# Patient Record
Sex: Female | Born: 1970 | Race: White | Hispanic: Yes | Marital: Married
Health system: Southern US, Community
[De-identification: ages and names within clinical notes are randomized; demographics above are authoritative.]

---

## 2005-11-01 ENCOUNTER — Emergency Department (HOSPITAL_COMMUNITY): Admission: EM | Admit: 2005-11-01 | Discharge: 2005-11-01 | Payer: Self-pay | Admitting: Emergency Medicine

## 2005-11-09 ENCOUNTER — Emergency Department (HOSPITAL_COMMUNITY): Admission: EM | Admit: 2005-11-09 | Discharge: 2005-11-09 | Payer: Self-pay | Admitting: Emergency Medicine

## 2005-12-18 ENCOUNTER — Inpatient Hospital Stay (HOSPITAL_COMMUNITY): Admission: AD | Admit: 2005-12-18 | Discharge: 2005-12-20 | Payer: Self-pay | Admitting: Obstetrics & Gynecology

## 2006-05-13 ENCOUNTER — Ambulatory Visit (HOSPITAL_COMMUNITY): Admission: RE | Admit: 2006-05-13 | Discharge: 2006-05-13 | Payer: Self-pay | Admitting: Obstetrics & Gynecology

## 2006-05-13 ENCOUNTER — Ambulatory Visit: Payer: Self-pay | Admitting: Obstetrics & Gynecology

## 2006-05-22 ENCOUNTER — Ambulatory Visit: Payer: Self-pay | Admitting: Gynecology

## 2006-06-05 ENCOUNTER — Ambulatory Visit: Payer: Self-pay | Admitting: Gynecology

## 2006-06-10 ENCOUNTER — Ambulatory Visit: Payer: Self-pay | Admitting: Certified Nurse Midwife

## 2006-06-10 ENCOUNTER — Inpatient Hospital Stay (HOSPITAL_COMMUNITY): Admission: AD | Admit: 2006-06-10 | Discharge: 2006-06-13 | Payer: Self-pay | Admitting: Family Medicine

## 2010-09-28 ENCOUNTER — Emergency Department (HOSPITAL_COMMUNITY): Admission: EM | Admit: 2010-09-28 | Discharge: 2010-09-28 | Payer: Self-pay | Admitting: Emergency Medicine

## 2010-09-29 ENCOUNTER — Ambulatory Visit (HOSPITAL_COMMUNITY): Admission: RE | Admit: 2010-09-29 | Discharge: 2010-09-29 | Payer: Self-pay | Admitting: Emergency Medicine

## 2011-03-07 LAB — URINALYSIS, ROUTINE W REFLEX MICROSCOPIC
Bilirubin Urine: NEGATIVE
Glucose, UA: NEGATIVE mg/dL
Hgb urine dipstick: NEGATIVE
Ketones, ur: NEGATIVE mg/dL
Nitrite: NEGATIVE
Protein, ur: NEGATIVE mg/dL
Specific Gravity, Urine: <1.005 — ABNORMAL LOW (ref 1.005–1.030)
Urobilinogen, UA: 0.2 mg/dL (ref 0.0–1.0)
pH: 5.5 (ref 5.0–8.0)

## 2011-03-07 LAB — COMPREHENSIVE METABOLIC PANEL
ALT: 18 U/L (ref 0–35)
AST: 20 U/L (ref 0–37)
Albumin: 4 g/dL (ref 3.5–5.2)
Alkaline Phosphatase: 57 U/L (ref 39–117)
BUN: 7 mg/dL (ref 6–23)
CO2: 25 mEq/L (ref 19–32)
Calcium: 8.9 mg/dL (ref 8.4–10.5)
Chloride: 107 mEq/L (ref 96–112)
Creatinine, Ser: 0.49 mg/dL (ref 0.4–1.2)
GFR calc Af Amer: >60 mL/min (ref >60)
GFR calc non Af Amer: >60 mL/min (ref >60)
Glucose, Bld: 106 mg/dL — ABNORMAL HIGH (ref 70–99)
Potassium: 3.5 mEq/L (ref 3.5–5.1)
Sodium: 138 mEq/L (ref 135–145)
Total Bilirubin: 0.5 mg/dL (ref 0.3–1.2)
Total Protein: 7.6 g/dL (ref 6.0–8.3)

## 2011-03-07 LAB — CBC
HCT: 38 % (ref 36.0–46.0)
Hemoglobin: 13.3 g/dL (ref 12.0–15.0)
MCH: 33.2 pg (ref 26.0–34.0)
MCHC: 35 g/dL (ref 30.0–36.0)
MCV: 94.9 fL (ref 78.0–100.0)
Platelets: 289 10*3/uL (ref 150–400)
RBC: 4 MIL/uL (ref 3.87–5.11)
RDW: 12.2 % (ref 11.5–15.5)
WBC: 7.4 10*3/uL (ref 4.0–10.5)

## 2011-03-07 LAB — LIPASE, BLOOD: Lipase: 35 U/L (ref 11–59)

## 2011-03-07 LAB — DIFFERENTIAL
Band Neutrophils: 0 % (ref 0–10)
Basophils Absolute: 0 10*3/uL (ref 0.0–0.1)
Basophils Relative: 1 % (ref 0–1)
Eosinophils Absolute: 0.1 10*3/uL (ref 0.0–0.7)
Eosinophils Relative: 1 % (ref 0–5)
Lymphocytes Relative: 27 % (ref 12–46)
Lymphs Abs: 2 10*3/uL (ref 0.7–4.0)
Monocytes Absolute: 0.6 10*3/uL (ref 0.1–1.0)
Monocytes Relative: 8 % (ref 3–12)
Neutro Abs: 4.7 10*3/uL (ref 1.7–7.7)
Neutrophils Relative %: 63 % (ref 43–77)

## 2011-03-07 LAB — PREGNANCY, URINE: Preg Test, Ur: NEGATIVE

## 2011-03-07 LAB — URINE MICROSCOPIC-ADD ON

## 2011-05-10 NOTE — H&P (Signed)
Hannah [name], Hannah [name]             ACCOUNT NO.:  [id number]   MEDICAL RECORD NO.:  [id number]          PATIENT TYPE:  OIB   LOCATION:  A428                          FACILITY:  [facility]   PHYSICIAN:  Tilda Burrow, M.D. DATE OF BIRTH:  1971-08-09   DATE OF ADMISSION:  12/17/2005  DATE OF DISCHARGE:  LH                                HISTORY & PHYSICAL   REASON FOR ADMISSION:  Pregnancy at 12 weeks with fever and dehydration.   HISTORY OF PRESENT ILLNESS:  Hannah [name] is a 40 year old gravida 4, para 3,  previous Cesarean section who has been sick for three days with fever,  nausea and vomiting, no other symptoms.  She presented to the office this  morning with dehydration with 3+ ketones.   PAST MEDICAL HISTORY:  Medical history is positive for pregnancy-induced  hypertension with her first pregnancy.   PAST SURGICAL HISTORY:  Positive for Cesarean section.   ALLERGIES:  She has no known allergies.   SOCIAL HISTORY:  She is married.  Her husband is with her.   OBSTETRICAL HISTORY:  Her prenatal course has been complicated with a  complete previa which was noted on December 04, 2005.  Blood type is O  positive.  Urine drug screen is negative.  Rubella is immune.  Hepatitis B  surface antigen is negative.  Human immunodeficiency virus is negative.  Serology is nonreactive.  Gonorrhea and Chlamydia are negative.   PHYSICAL EXAMINATION:  HEART:  On physical examination today the heart is  regular to rhythm and rate.  LUNGS:  Clear to auscultation bilaterally.  VITAL SIGNS:  Temperature is 100.3 orally.  Patient has had Tylenol several  hours ago.  Fetal heart rate 170's strong and regular with Doppler.  ABDOMEN:  There is some abdominal tenderness noted and it is more tender on  the left side than the right. She does complain of moderate to severe left  lower quadrant pain.   PLAN:  We are going to admit. Get a CBC, CMP, hydration, get an ultrasound  to assess her left lower quadrant  pain and consult Dr. Emelda Fear in regards  to treatment plans.      Zerita Boers, Lanier Clam      Tilda Burrow, M.D.  Electronically Signed    DL/MEDQ  D:  40/98/1191  T:  12/17/2005  Job:  [id number]

## 2011-05-10 NOTE — Discharge Summary (Signed)
[name], [name]            ACCOUNT NO.:  [id number]   MEDICAL RECORD NO.:  [id number]          PATIENT TYPE:  INP   LOCATION:  A428                          FACILITY:  [facility]   PHYSICIAN:  Lazaro Arms, M.D.   DATE OF BIRTH:  13-Apr-1971   DATE OF ADMISSION:  12/18/2005  DATE OF DISCHARGE:  12/29/2006LH                                 DISCHARGE SUMMARY   DISCHARGE DIAGNOSES:  1.  Intrauterine pregnancy at 12-weeks gestation.  2.  Viral gastroenteritis.  3.  Urinary tract infection.   HISTORY OF PRESENT ILLNESS:  Please refer to the transcribed History and  Physical for details of admission to the hospital.   HOSPITAL COURSE:  Hannah [name] was admitted.  She is a gravida 4, para 3, admitted  with a three day history of nausea, vomiting, fever and dehydration.  She  came to the office and decided to put her in the hospital for IV fluids and  emesis control.  The patient was also found to have large number of white  cells and positive nitrites in the urine, and she was treated with Macrobid.  Her potassium was 2.9 and after IV fluid came up to 3.2.  Her nausea and  vomiting resolved.  She felt much better and had no diarrhea and no fever 24  hours prior to her discharge.  She was discharged to home the morning of  December 20, 2005, to follow up in the office next week as scheduled.  She  was given Phenergan for nausea and Macrobid for her urinary tract infection  and instructions to come back to the office prior to her scheduled  appointment if needed.      Lazaro Arms, M.D.  Electronically Signed     LHE/MEDQ  D:  01/08/2006  T:  01/08/2006  Job:  [id number]

## 2012-06-15 ENCOUNTER — Other Ambulatory Visit (HOSPITAL_COMMUNITY): Payer: Self-pay | Admitting: Nurse Practitioner

## 2012-06-15 DIAGNOSIS — Z139 Encounter for screening, unspecified: Secondary | ICD-10-CM

## 2012-06-18 ENCOUNTER — Ambulatory Visit (HOSPITAL_COMMUNITY)
Admission: RE | Admit: 2012-06-18 | Discharge: 2012-06-18 | Disposition: A | Discharge status: Home / Self Care | Payer: Self-pay | Source: Ambulatory Visit | Attending: Nurse Practitioner | Admitting: Nurse Practitioner

## 2012-06-18 DIAGNOSIS — Z139 Encounter for screening, unspecified: Secondary | ICD-10-CM

## 2013-09-15 ENCOUNTER — Other Ambulatory Visit (HOSPITAL_COMMUNITY): Payer: Self-pay | Admitting: Nurse Practitioner

## 2013-09-15 DIAGNOSIS — Z139 Encounter for screening, unspecified: Secondary | ICD-10-CM

## 2013-09-21 ENCOUNTER — Ambulatory Visit (HOSPITAL_COMMUNITY)
Admission: RE | Admit: 2013-09-21 | Discharge: 2013-09-21 | Disposition: A | Discharge status: Home / Self Care | Payer: Self-pay | Source: Ambulatory Visit | Attending: Nurse Practitioner | Admitting: Nurse Practitioner

## 2013-09-21 DIAGNOSIS — Z139 Encounter for screening, unspecified: Secondary | ICD-10-CM

## 2014-12-12 ENCOUNTER — Other Ambulatory Visit (HOSPITAL_COMMUNITY): Payer: Self-pay | Admitting: Nurse Practitioner

## 2014-12-12 DIAGNOSIS — Z139 Encounter for screening, unspecified: Secondary | ICD-10-CM

## 2014-12-19 ENCOUNTER — Ambulatory Visit (HOSPITAL_COMMUNITY)
Admission: RE | Admit: 2014-12-19 | Discharge: 2014-12-19 | Disposition: A | Discharge status: Home / Self Care | Payer: Self-pay | Source: Ambulatory Visit | Attending: Nurse Practitioner | Admitting: Nurse Practitioner

## 2014-12-19 DIAGNOSIS — Z139 Encounter for screening, unspecified: Secondary | ICD-10-CM

## 2016-08-01 ENCOUNTER — Other Ambulatory Visit (HOSPITAL_COMMUNITY): Payer: Self-pay | Admitting: Nurse Practitioner

## 2016-08-01 DIAGNOSIS — Z1231 Encounter for screening mammogram for malignant neoplasm of breast: Secondary | ICD-10-CM

## 2016-08-07 ENCOUNTER — Encounter (HOSPITAL_COMMUNITY): Payer: Self-pay

## 2016-08-08 ENCOUNTER — Other Ambulatory Visit (HOSPITAL_COMMUNITY): Payer: Self-pay | Admitting: *Deleted

## 2016-08-08 DIAGNOSIS — N6452 Nipple discharge: Secondary | ICD-10-CM

## 2016-08-20 ENCOUNTER — Ambulatory Visit (HOSPITAL_COMMUNITY)
Admission: RE | Admit: 2016-08-20 | Discharge: 2016-08-20 | Disposition: A | Discharge status: Home / Self Care | Payer: PRIVATE HEALTH INSURANCE | Source: Ambulatory Visit | Attending: *Deleted | Admitting: *Deleted

## 2016-08-20 ENCOUNTER — Other Ambulatory Visit (HOSPITAL_COMMUNITY): Payer: Self-pay | Admitting: *Deleted

## 2016-08-20 ENCOUNTER — Ambulatory Visit (HOSPITAL_COMMUNITY): Payer: Self-pay

## 2016-08-20 ENCOUNTER — Inpatient Hospital Stay (HOSPITAL_COMMUNITY): Admission: RE | Admit: 2016-08-20 | Payer: Self-pay | Source: Ambulatory Visit

## 2016-08-20 ENCOUNTER — Ambulatory Visit (HOSPITAL_COMMUNITY): Payer: PRIVATE HEALTH INSURANCE

## 2016-08-20 DIAGNOSIS — N6452 Nipple discharge: Secondary | ICD-10-CM | POA: Diagnosis not present

## 2016-08-23 ENCOUNTER — Other Ambulatory Visit (HOSPITAL_COMMUNITY): Payer: Self-pay | Admitting: *Deleted

## 2016-08-23 DIAGNOSIS — N6452 Nipple discharge: Secondary | ICD-10-CM

## 2016-09-03 ENCOUNTER — Encounter (HOSPITAL_COMMUNITY): Payer: Self-pay

## 2016-09-03 ENCOUNTER — Other Ambulatory Visit (HOSPITAL_COMMUNITY): Payer: Self-pay | Admitting: *Deleted

## 2016-09-03 ENCOUNTER — Ambulatory Visit (HOSPITAL_COMMUNITY)
Admission: RE | Admit: 2016-09-03 | Discharge: 2016-09-03 | Disposition: A | Discharge status: Home / Self Care | Payer: PRIVATE HEALTH INSURANCE | Source: Ambulatory Visit | Attending: *Deleted | Admitting: *Deleted

## 2016-09-03 DIAGNOSIS — D241 Benign neoplasm of right breast: Secondary | ICD-10-CM | POA: Diagnosis not present

## 2016-09-03 DIAGNOSIS — N6310 Unspecified lump in the right breast, unspecified quadrant: Secondary | ICD-10-CM

## 2016-09-03 DIAGNOSIS — N6452 Nipple discharge: Secondary | ICD-10-CM | POA: Diagnosis present

## 2016-09-03 MED ORDER — LIDOCAINE-EPINEPHRINE (PF) 1 %-1:200000 IJ SOLN
INTRAMUSCULAR | Status: AC
  Administered 2016-09-03: 30 mL
  Filled 2016-09-03: qty 30

## 2016-09-03 MED ORDER — LIDOCAINE HCL (PF) 2 % IJ SOLN
INTRAMUSCULAR | Status: AC
  Administered 2016-09-03: 10 mL
  Filled 2016-09-03: qty 10

## 2016-09-25 IMAGING — US US  BREAST BX W/ LOC DEV 1ST LESION IMG BX SPEC US GUIDE*R*
1 series · 7 of 7 positions shown · non-contrast
Comparison: Previous exam(s).

ADDENDUM:
Pathology of the right breast biopsy revealed an INTRADUCTAL
PAPILLOMA. This was found to be concordant by Dr. Bremer.

Recommendation:  Surgical excision.
Results and recommendations were relayed to Ourari Tiger, [REDACTED]
coordinator for [facility] [REDACTED] on 09/04/16 by
Sanjaen, Jeanermann ([REDACTED]). She stated an appointment
has been made for the patient at [HOSPITAL] on
09/18/16 at [DATE]. Results and recommendations were relayed to the
patient by [place], interpreter for [facility] [REDACTED] on 09/04/16. She stated she has done well following the
biopsy with no bleeding, bruising, or hematoma. She was also
informed of her surgical appointment. All of her questions were
answered. She was encouraged to call the [facility] [REDACTED] with any further questions or concerns.
Addendum by Sanjaen, Jeanermann on 09/05/16.
CLINICAL DATA: 45-year-old female with history of spontaneous right
nipple discharge presenting for ultrasound-guided biopsy of a
subareolar right breast mass.
EXAM:
ULTRASOUND GUIDED RIGHT BREAST CORE NEEDLE BIOPSY

[Series 1: us breast bx w/ loc dev 1st lesion img bx spec us  · 0.07mm/px · 7 of 7 slices shown]
[im 1/7]
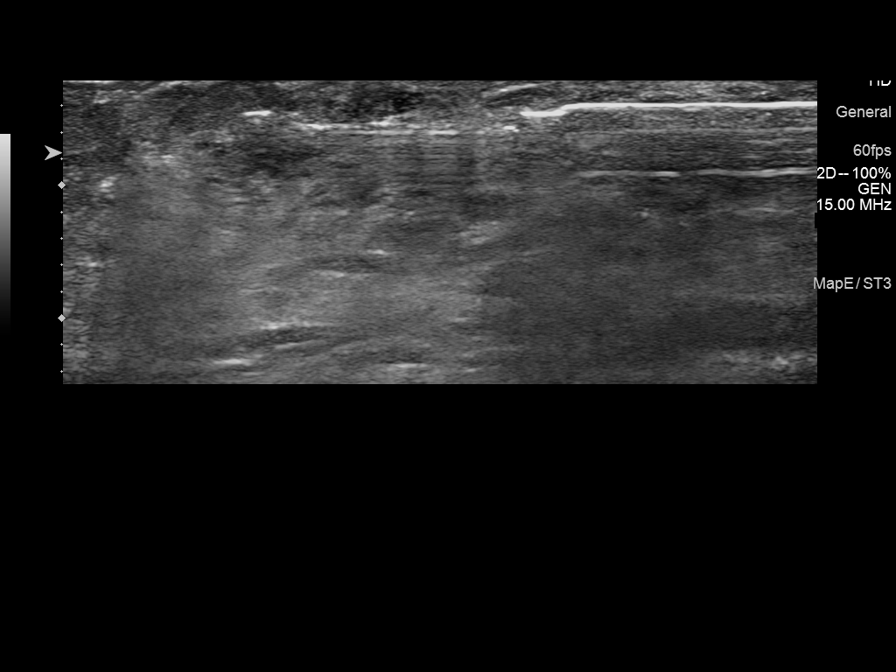
[im 2/7]
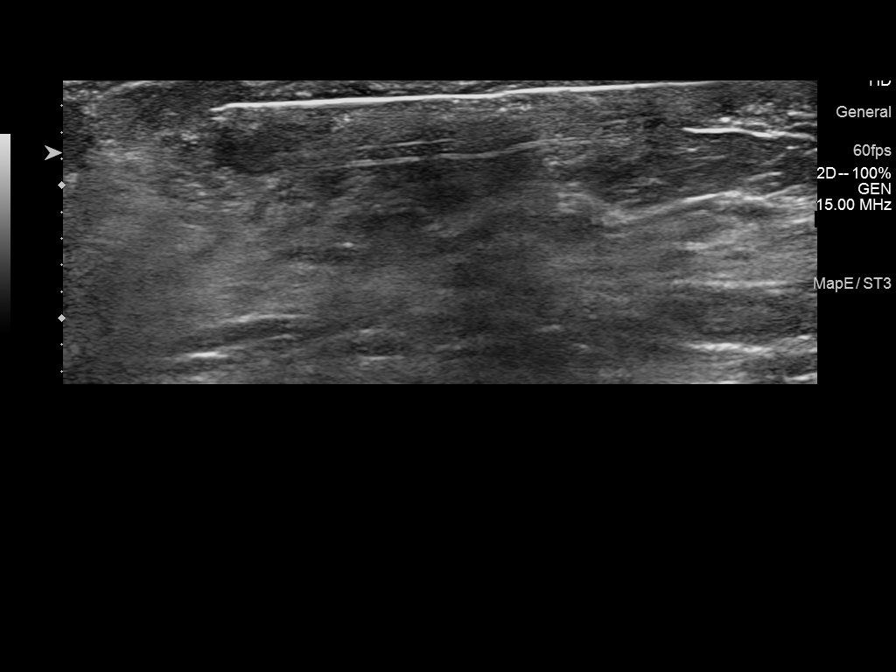
[im 3/7]
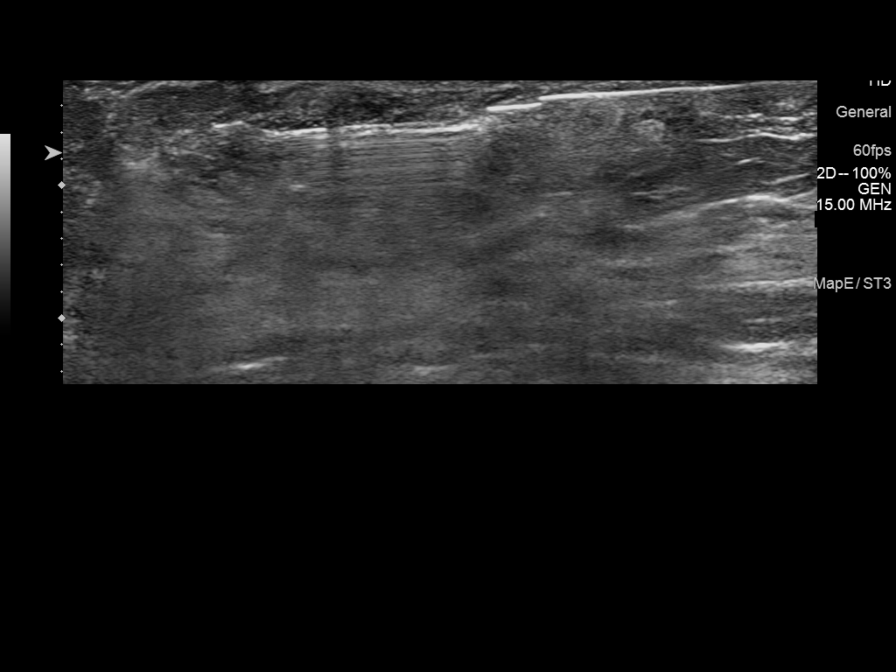
[im 4/7]
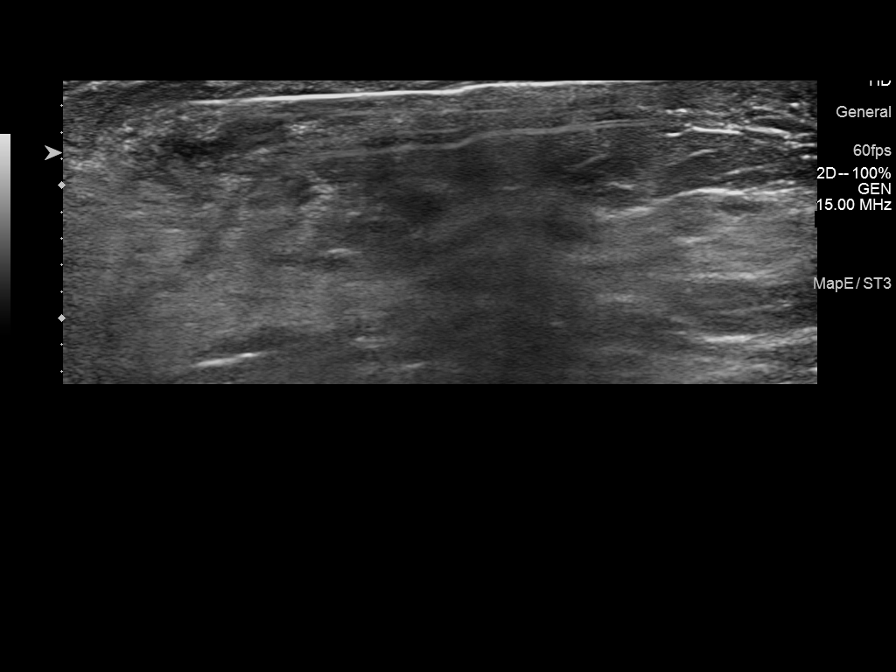
[im 5/7]
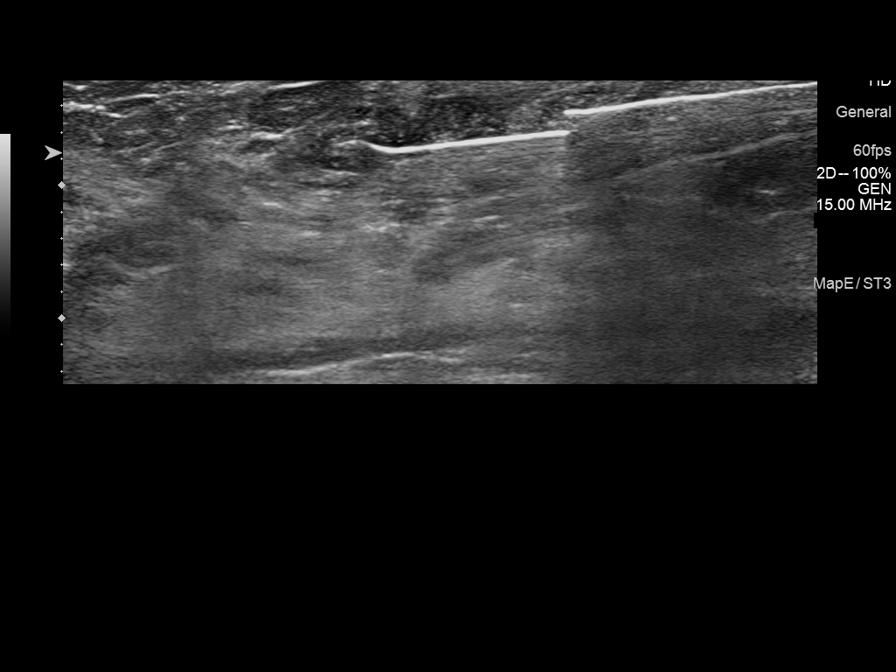
[im 6/7]
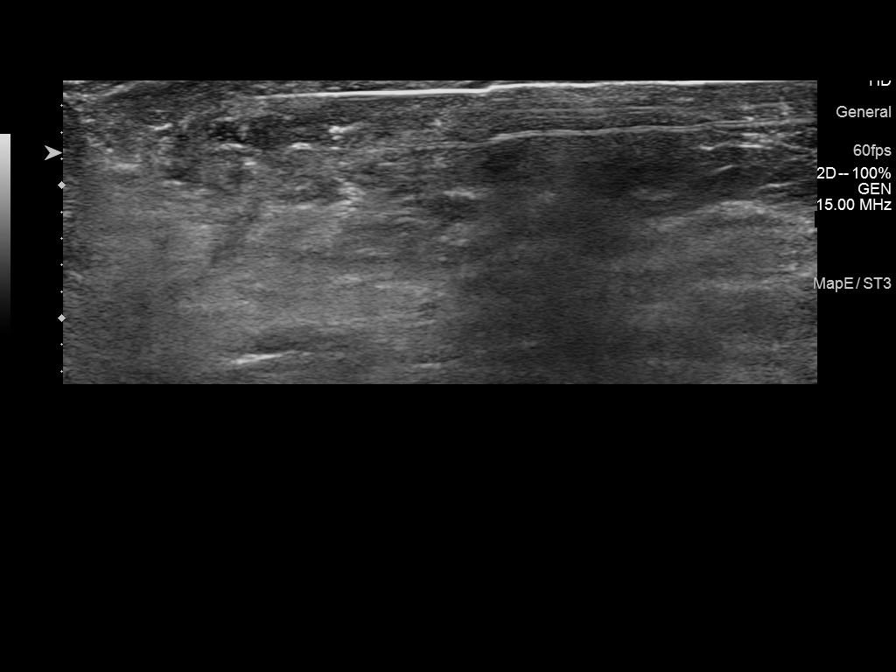
[im 7/7]
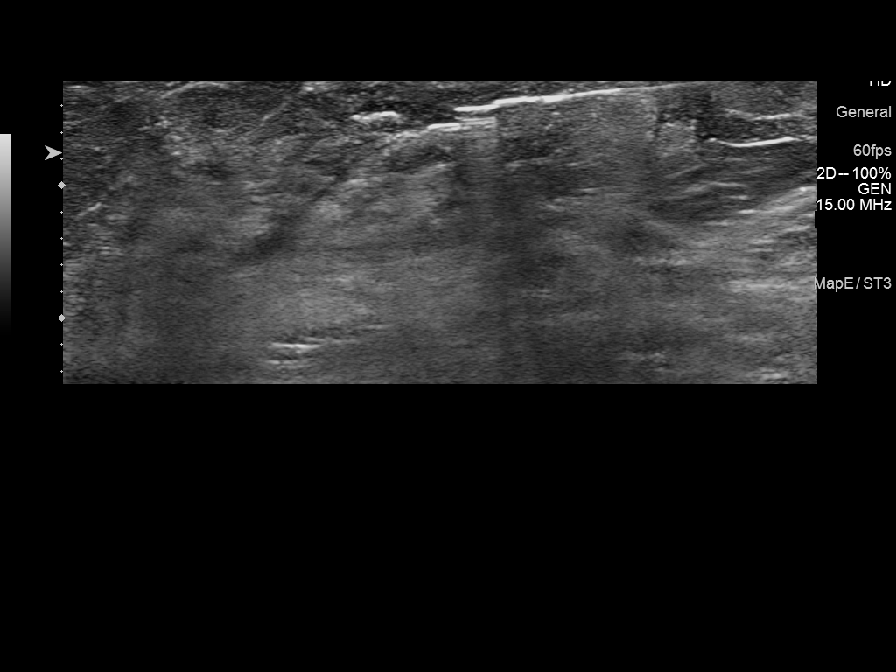

[7 of 7 positions shown; findings below may reference images not displayed]



Using sterile technique and 1% Lidocaine as local anesthetic, under
direct ultrasound visualization, a 14 gauge Tiger device was
used to perform biopsy of subareolar right breast mass using a
medial approach. At the conclusion of the procedure a ribbon shaped
tissue marker clip was deployed into the biopsy cavity. Follow up 2
view mammogram was performed and dictated separately.
IMPRESSION: Ultrasound guided biopsy of subareolar right breast mass. No
apparent complications.

## 2016-10-22 ENCOUNTER — Other Ambulatory Visit (HOSPITAL_COMMUNITY): Payer: Self-pay

## 2016-10-24 ENCOUNTER — Encounter (HOSPITAL_COMMUNITY): Admission: RE | Payer: Self-pay | Source: Ambulatory Visit

## 2016-10-24 ENCOUNTER — Ambulatory Visit (HOSPITAL_COMMUNITY): Admission: RE | Admit: 2016-10-24 | Payer: Self-pay | Source: Ambulatory Visit | Admitting: Surgery

## 2016-10-24 SURGERY — BREAST BIOPSY
Anesthesia: General | Laterality: Right

## 2017-09-05 IMAGING — US ULTRASOUND RIGHT BREAST LIMITED
1 series · 12 of 12 positions shown · non-contrast
Comparison: Previous exam(s).

CLINICAL DATA: 45-year-old female with history of clear right
nipple discharge. The patient states this has been spontaneous and
occurring for the approximate past 2 months. Patient feels as if
this only comes out centrally from the right nipple. Denies any left
nipple discharge.

EXAM:
2D DIGITAL DIAGNOSTIC BILATERAL MAMMOGRAM WITH CAD AND ADJUNCT TOMO
RIGHT BREAST ULTRASOUND

[Series 1: ultrasound right breast limited · 0.06mm/px · 12 of 12 slices shown]
[im 1/12]
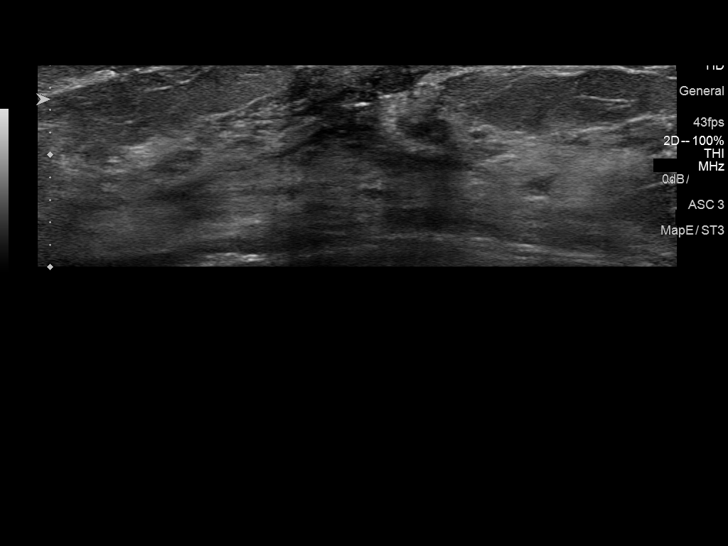
[im 2/12]
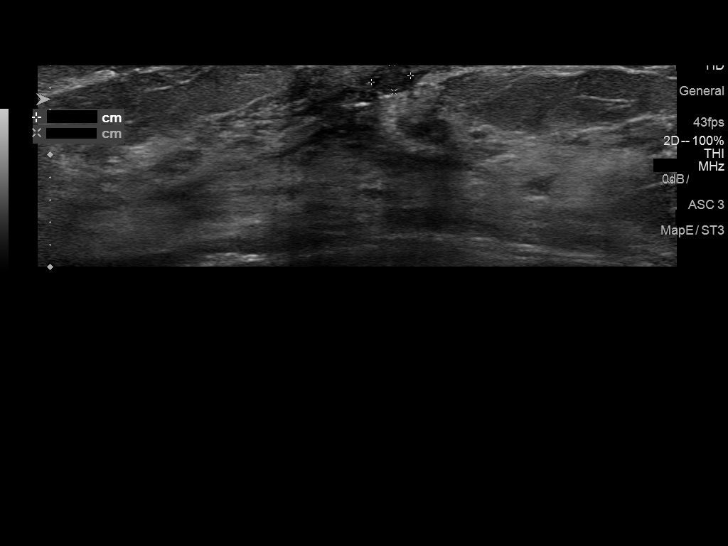
[im 3/12]
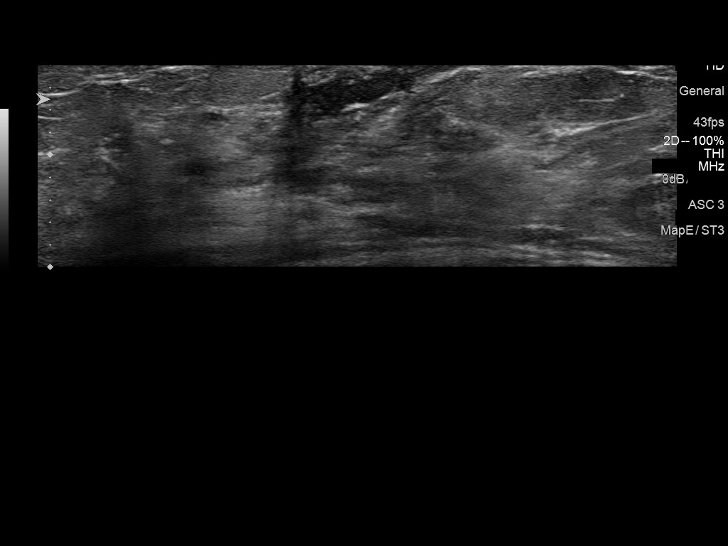
[im 4/12]
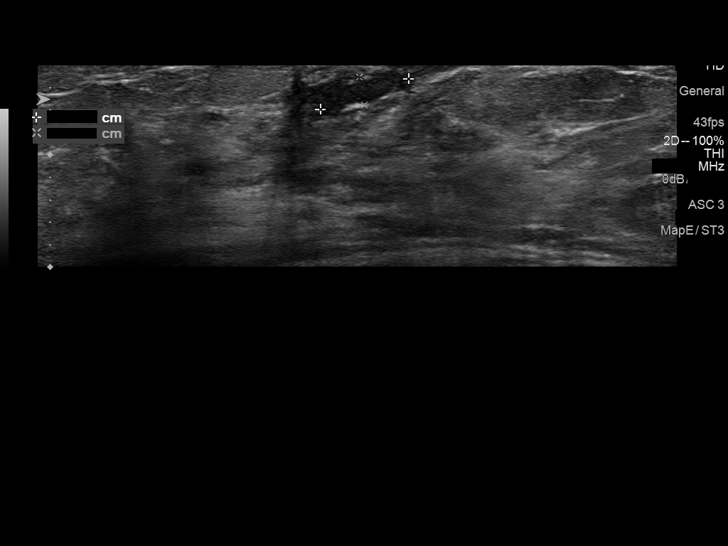
[im 5/12]
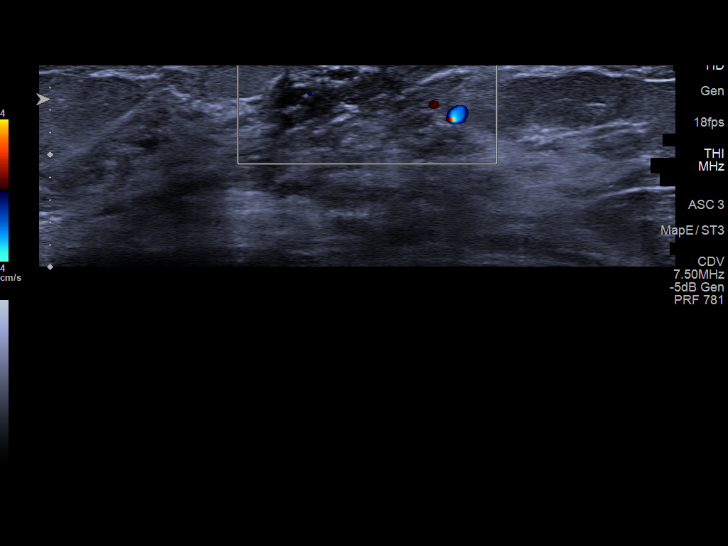
[im 6/12]
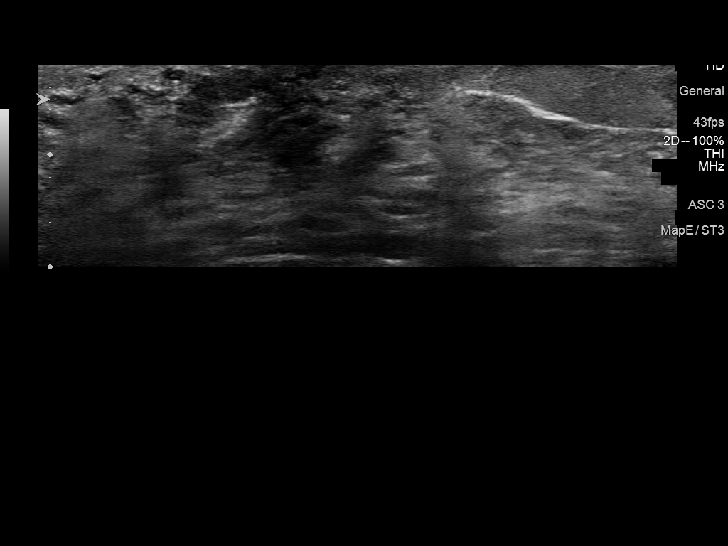
[im 7/12]
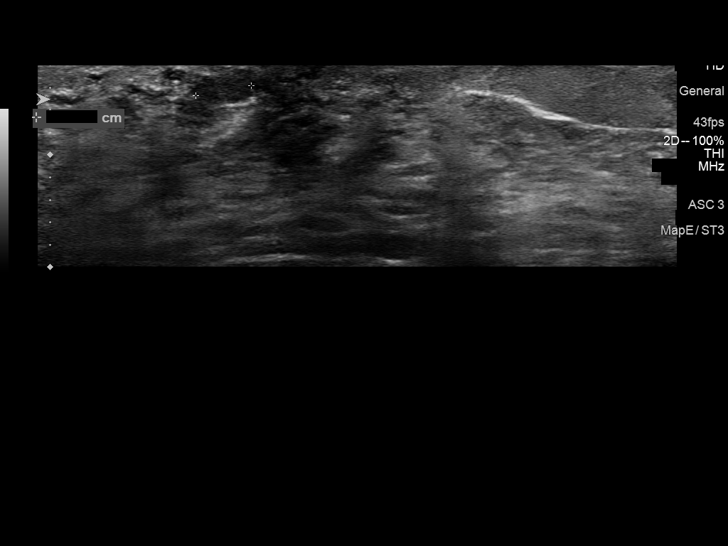
[im 8/12]
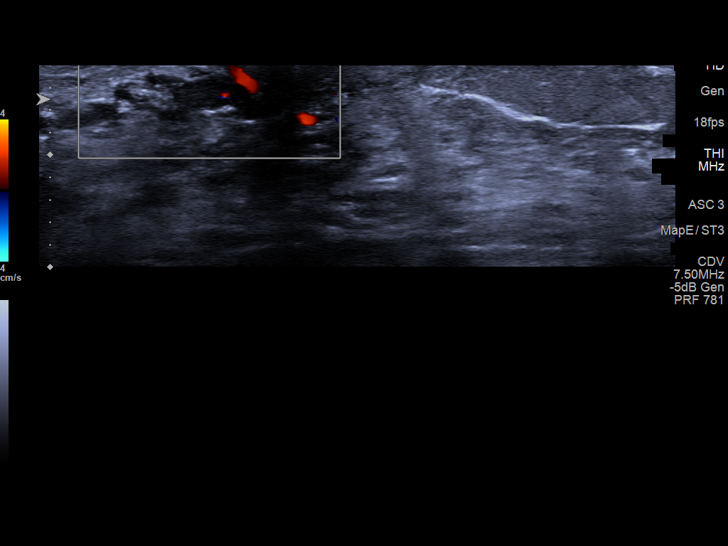
[im 9/12]
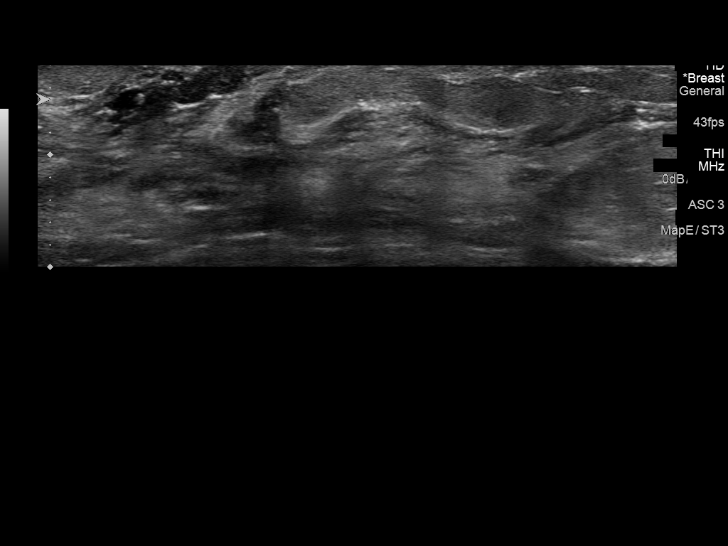
[im 10/12]
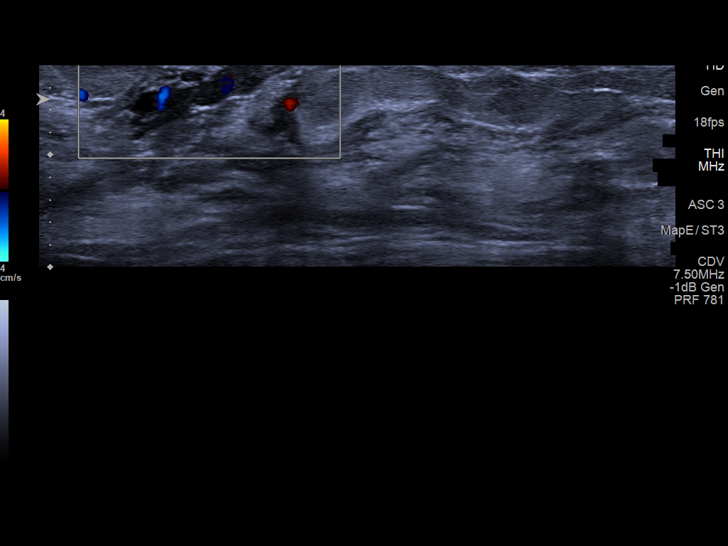
[im 11/12]
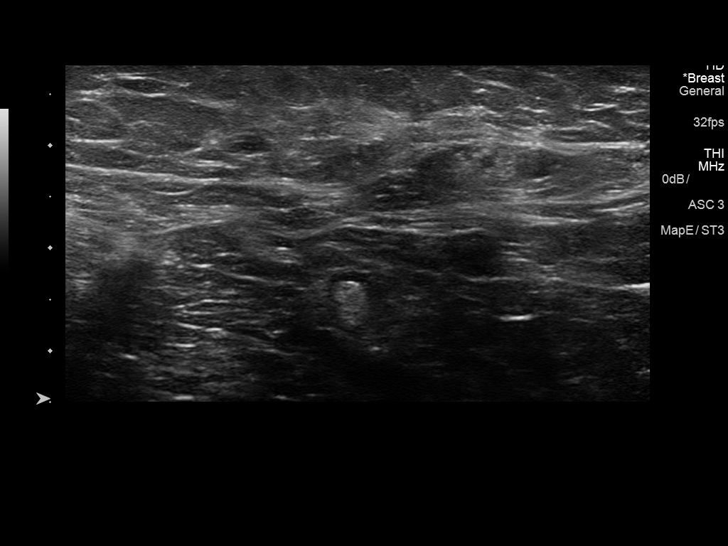
[im 12/12]
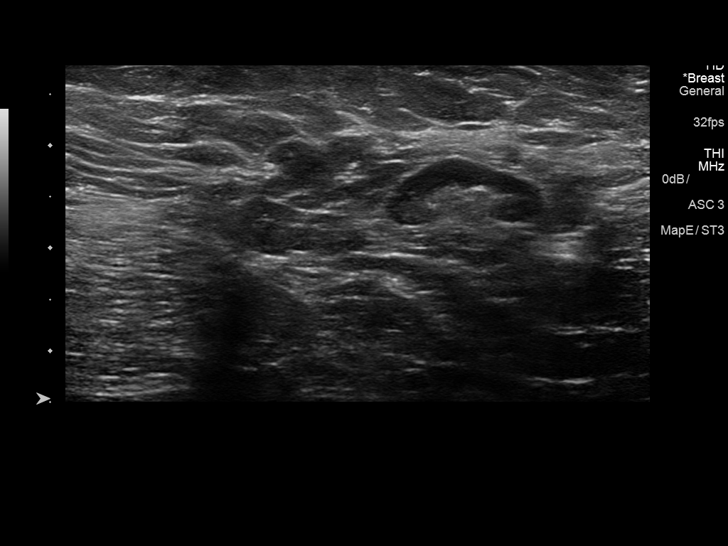

[12 of 12 positions shown; findings below may reference images not displayed]

ACR Breast Density Category c: The breast tissue is heterogeneously
dense, which may obscure small masses.
FINDINGS: No suspicious masses or calcifications are seen in either breast.
There is no mammographic evidence of malignancy in either breast.

Mammographic images were processed with CAD.

Physical examination of the central right breast does not reveal any
palpable masses. No nipple discharge is elicited.

Targeted ultrasound of the subareolar right breast was performed
demonstrating a mildly dilated duct containing debris versus mass
and measuring approximately 0.8 x 0.3 x 0.5 cm. No lymphadenopathy
seen in the right axilla.
IMPRESSION: Right breast dilated subareolar duct containing intraductal
material, debris versus mass.

RECOMMENDATION:
Ultrasound-guided biopsy of the subareolar duct containing material,
debris versus mass is recommended. The patient will return to [REDACTED]
for further evaluation prior to this biopsy being scheduled.

I have discussed the findings and recommendations with the patient.
Results were also provided in writing at the conclusion of the
visit. If applicable, a reminder letter will be sent to the patient
regarding the next appointment.

BI-RADS CATEGORY  4: Suspicious.

## 2017-12-11 ENCOUNTER — Other Ambulatory Visit (HOSPITAL_COMMUNITY): Payer: Self-pay | Admitting: *Deleted

## 2017-12-11 DIAGNOSIS — Z1231 Encounter for screening mammogram for malignant neoplasm of breast: Secondary | ICD-10-CM

## 2017-12-17 ENCOUNTER — Ambulatory Visit (HOSPITAL_COMMUNITY)
Admission: RE | Admit: 2017-12-17 | Discharge: 2017-12-17 | Disposition: A | Discharge status: Home / Self Care | Payer: PRIVATE HEALTH INSURANCE | Source: Ambulatory Visit | Attending: *Deleted | Admitting: *Deleted

## 2017-12-17 DIAGNOSIS — Z1231 Encounter for screening mammogram for malignant neoplasm of breast: Secondary | ICD-10-CM | POA: Diagnosis not present

## 2018-11-23 ENCOUNTER — Encounter (HOSPITAL_COMMUNITY): Payer: Self-pay | Admitting: Emergency Medicine

## 2018-11-23 ENCOUNTER — Other Ambulatory Visit: Payer: Self-pay

## 2018-11-23 ENCOUNTER — Emergency Department (HOSPITAL_COMMUNITY)
Admission: EM | Admit: 2018-11-23 | Discharge: 2018-11-23 | Disposition: A | Discharge status: Home / Self Care | Payer: Self-pay | Attending: Emergency Medicine | Admitting: Emergency Medicine

## 2018-11-23 ENCOUNTER — Emergency Department (HOSPITAL_COMMUNITY): Payer: Self-pay

## 2018-11-23 DIAGNOSIS — R1030 Lower abdominal pain, unspecified: Secondary | ICD-10-CM | POA: Insufficient documentation

## 2018-11-23 DIAGNOSIS — N938 Other specified abnormal uterine and vaginal bleeding: Secondary | ICD-10-CM | POA: Insufficient documentation

## 2018-11-23 DIAGNOSIS — N939 Abnormal uterine and vaginal bleeding, unspecified: Secondary | ICD-10-CM

## 2018-11-23 LAB — CBC
HCT: 38.3 % (ref 36.0–46.0)
Hemoglobin: 12.2 g/dL (ref 12.0–15.0)
MCH: 30.9 pg (ref 26.0–34.0)
MCHC: 31.9 g/dL (ref 30.0–36.0)
MCV: 97 fL (ref 80.0–100.0)
Platelets: 365 10*3/uL (ref 150–400)
RBC: 3.95 MIL/uL (ref 3.87–5.11)
RDW: 12.1 % (ref 11.5–15.5)
WBC: 5.9 10*3/uL (ref 4.0–10.5)
nRBC: 0 % (ref 0.0–0.2)

## 2018-11-23 LAB — URINALYSIS, ROUTINE W REFLEX MICROSCOPIC

## 2018-11-23 LAB — TYPE AND SCREEN
ABO/RH(D): O POS
Antibody Screen: NEGATIVE

## 2018-11-23 LAB — COMPREHENSIVE METABOLIC PANEL WITH GFR
ALT: 39 U/L (ref 0–44)
AST: 38 U/L (ref 15–41)
Albumin: 3.8 g/dL (ref 3.5–5.0)
Alkaline Phosphatase: 61 U/L (ref 38–126)
Anion gap: 7 (ref 5–15)
BUN: 8 mg/dL (ref 6–20)
CO2: 24 mmol/L (ref 22–32)
Calcium: 8.9 mg/dL (ref 8.9–10.3)
Chloride: 105 mmol/L (ref 98–111)
Creatinine, Ser: 0.51 mg/dL (ref 0.44–1.00)
GFR calc Af Amer: >60 mL/min
GFR calc non Af Amer: >60 mL/min
Glucose, Bld: 93 mg/dL (ref 70–99)
Potassium: 3.8 mmol/L (ref 3.5–5.1)
Sodium: 136 mmol/L (ref 135–145)
Total Bilirubin: 0.6 mg/dL (ref 0.3–1.2)
Total Protein: 7.2 g/dL (ref 6.5–8.1)

## 2018-11-23 LAB — URINALYSIS, MICROSCOPIC (REFLEX): RBC / HPF: >50 RBC/hpf (ref 0–5)

## 2018-11-23 LAB — I-STAT BETA HCG BLOOD, ED (MC, WL, AP ONLY): I-stat hCG, quantitative: <5 m[IU]/mL

## 2018-11-23 LAB — WET PREP, GENITAL
Clue Cells Wet Prep HPF POC: NONE SEEN
Sperm: NONE SEEN
TRICH WET PREP: NONE SEEN
YEAST WET PREP: NONE SEEN

## 2018-11-23 LAB — LIPASE, BLOOD: Lipase: 30 U/L (ref 11–51)

## 2018-11-23 LAB — ABO/RH: ABO/RH(D): O POS

## 2018-11-23 MED ORDER — MORPHINE SULFATE (PF) 4 MG/ML IV SOLN
4.0000 mg | Freq: Once | INTRAVENOUS | Status: DC
  Filled 2018-11-23: qty 1

## 2018-11-23 MED ORDER — FLUCONAZOLE 150 MG PO TABS: 150.0000 mg | ORAL_TABLET | Freq: Every day | ORAL | 0 refills | Status: AC

## 2018-11-23 MED ORDER — MEDROXYPROGESTERONE ACETATE 10 MG PO TABS: 10.0000 mg | ORAL_TABLET | Freq: Three times a day (TID) | ORAL | 0 refills | Status: AC

## 2018-11-23 MED ORDER — ONDANSETRON HCL 4 MG/2ML IJ SOLN
4.0000 mg | Freq: Once | INTRAMUSCULAR | Status: DC
  Filled 2018-11-23: qty 2

## 2018-11-23 MED ORDER — SODIUM CHLORIDE 0.9 % IV BOLUS
1000.0000 mL | Freq: Once | INTRAVENOUS | Status: AC
  Administered 2018-11-23: 1000 mL via INTRAVENOUS

## 2018-11-23 NOTE — ED Provider Notes (Signed)
[facility] EMERGENCY DEPARTMENT Provider Note   CSN: [id number] Arrival date & time: 11/23/18  1220     History   Chief Complaint Chief Complaint  Patient presents with  . Vaginal Bleeding  . Abdominal Pain    HPI Hannah Black is a 47 y.o. female.  Hannah Black is a 47 y.o. Female who is otherwise healthy, does not regularly see a doctor, who presents to the emergency department for evaluation of vaginal bleeding and abdominal pain.  Patient reports that she has been passing large amounts of blood and clots consistently for the past 8 days and over the past 2 to 3 days it is gotten worse.  She reports associated intermittent pain across the lower abdomen and vaginal discomfort.  Denies vaginal discharge.  No fevers or chills, she has had some nausea but no vomiting.  No urinary symptoms.  She reports feeling weak and tired, denies chest pain or shortness of breath.  No palpitations.  She is felt lightheaded but not had any syncopal episodes.  Patient denies history of issues with dysfunctional uterine bleeding in the past.  Patient is Spanish-speaking and her daughter is at the bedside and assists with history.  Daughter also reports that she noticed her mom was warm to the touch today, but she was bundled up in multiple jackets, has not had any fevers throughout the week at home.  She reports that is been 3 days since she saw her mom last and her complexion looks more pale.     History reviewed. No pertinent past medical history.  There are no active problems to display for this patient.   History reviewed. No pertinent surgical history.   OB History   None      Home Medications    Prior to Admission medications   Not on File    Family History History reviewed. No pertinent family history.  Social History Social History   Tobacco Use  . Smoking status: Not on file  Substance Use Topics  . Alcohol use: Not on file  . Drug use: Not on file      Allergies   Patient has no known allergies.   Review of Systems Review of Systems  Constitutional: Positive for fatigue. Negative for chills and fever.  HENT: Negative.   Respiratory: Negative for cough and shortness of breath.   Cardiovascular: Negative for chest pain, palpitations and leg swelling.  Gastrointestinal: Positive for abdominal pain and nausea. Negative for blood in stool, constipation, diarrhea and vomiting.  Genitourinary: Positive for menstrual problem, pelvic pain, vaginal bleeding and vaginal pain. Negative for dysuria and frequency.  Musculoskeletal: Negative for arthralgias and myalgias.  Skin: Positive for pallor. Negative for color change, rash and wound.  Neurological: Positive for weakness (generalized) and light-headedness. Negative for dizziness, seizures, syncope, numbness and headaches.     Physical Exam Updated Vital Signs BP 125/86 (BP Location: Right Arm)   Pulse 97   Temp 98.7 F (37.1 C) (Oral)   Resp 18   Ht 5' (1.524 m)   Wt 61.2 kg   LMP 11/15/2018   SpO2 99%   BMI 26.37 kg/m   Physical Exam  Constitutional: She appears well-developed and well-nourished.  Non-toxic appearance. She does not appear ill. No distress.  HENT:  Head: Normocephalic and atraumatic.  Mouth/Throat: Oropharynx is clear and moist.  Eyes: Right eye exhibits no discharge. Left eye exhibits no discharge.  Cardiovascular: Normal rate, regular rhythm, normal heart sounds and  intact distal pulses.  Pulmonary/Chest: Effort normal and breath sounds normal. No respiratory distress.  Respirations equal and unlabored, patient able to speak in full sentences, lungs clear to auscultation bilaterally  Abdominal: Soft. Normal appearance and bowel sounds are normal. She exhibits no distension. There is generalized tenderness and tenderness in the right lower quadrant, suprapubic area and left lower quadrant. There is no rigidity, no rebound, no guarding and no CVA tenderness.   Abdomen is soft, nondistended, bowel sounds present throughout, there is mild generalized tenderness, most severe in the lower abdomen, but no guarding, rigidity or rebound tenderness.  No CVA tenderness bilaterally.  Genitourinary: There is bleeding in the vagina.  Genitourinary Comments: No external genital lesions noted. Speculum exam reveals large amount of blood pooled in the vagina which was removed with Fox swabs with several dime size clots present, there is no active brisk bleeding from the cervical loss Annual exam patient has discomfort throughout but there is no focal cervical motion tenderness, no palpable adnexal masses bilaterally.  Neurological: She is alert. Coordination normal.  Skin: Skin is warm and dry. Capillary refill takes less than 2 seconds. She is not diaphoretic.  Psychiatric: She has a normal mood and affect. Her behavior is normal.  Nursing note and vitals reviewed.    ED Treatments / Results  Labs (all labs ordered are listed, but only abnormal results are displayed) Labs Reviewed  WET PREP, GENITAL - Abnormal; Notable for the following components:      Result Value   WBC, Wet Prep HPF POC FEW (*)    All other components within normal limits  LIPASE, BLOOD  COMPREHENSIVE METABOLIC PANEL  CBC  URINALYSIS, ROUTINE W REFLEX MICROSCOPIC  RPR  HIV ANTIBODY (ROUTINE TESTING W REFLEX)  I-STAT BETA HCG BLOOD, ED ([facility], [facility], [facility] ONLY)  TYPE AND SCREEN  ABO/RH  GC/CHLAMYDIA PROBE AMP ([place]) NOT AT [facility]    EKG None  Radiology No results found.  Procedures Procedures (including critical care time)  Medications Ordered in ED Medications  ondansetron (ZOFRAN) injection 4 mg (0 mg Intravenous Hold 11/23/18 1358)  morphine 4 MG/ML injection 4 mg (0 mg Intravenous Hold 11/23/18 1358)  sodium chloride 0.9 % bolus 1,000 mL (1,000 mLs Intravenous New Bag/Given 11/23/18 1358)     Initial Impression / Assessment and Plan / ED Course  I have reviewed the  triage vital signs and the nursing notes.  Pertinent labs & imaging results that were available during my care of the patient were reviewed by me and considered in my medical decision making (see chart for details).  Patient presents with 8 days of heavy vaginal bleeding, no history of previous issues with DUB.  Patient reports associated lower abdominal pain and pelvic discomfort.  She reports feeling increasingly fatigued and weak with some lightheadedness due to heavy bleeding.  Daughter also expresses some concern over jaundice although the patient appears more pale than jaundiced here in the emergency department.  Daughter expresses concern that she is warm to the touch but she is afebrile here and has not had any fevers at home.  On arrival she has normal vitals.  Abdomen with mild diffuse tenderness most noted in the lower abdomen with no focal area of guarding.  Pelvic exam reveals large amount of pooled blood with clots present but no brisk bleeding from the cervical loss.  Patient has tenderness throughout bimanual exam but no focal cervical motion tenderness.  Will get pelvic ultrasound and abdominal labs.  Will  give IV fluids, morphine and Zofran.  Evaluation is very reassuring, despite heavy bleeding patient has stable hemoglobin of 12.2, no leukocytosis, normal electrolytes, no renal or liver dysfunction, no elevated bilirubin to suggest jaundice as patient's daughter was concerned about.  Normal lipase, wet prep shows a few WBCs but no other acute abnormalities, urinalysis is pending as well as STD testing.  Patient is resting comfortably, and vitals have remained stable.  At shift change care signed out to Williamson who will follow-up on pelvic ultrasound and touch base with OB/GYN given persistent bleeding, if no acute abnormalities patient will likely need Megace and close follow-up with OB/GYN.   Final Clinical Impressions(s) / ED Diagnoses   Final diagnoses:  Vaginal  bleeding  Lower abdominal pain    ED Discharge Orders    None       Jacqlyn Larsen, [place] 11/23/18 1634    Orlie Dakin, MD 11/23/18 [phone]

## 2018-11-23 NOTE — ED Triage Notes (Signed)
Pt reports vaginal bleeding x8 days with clots, reporting lower abd and vaginal pain. Also jaundiced and hot to the touch in triage, weak.

## 2018-11-23 NOTE — Discharge Instructions (Addendum)
Please take 1 tablet of Provera every 8 hours for the next 7 days.  If your vaginal bleeding stops you may discontinue the medication.  If your vaginal bleeding continues you will need to follow-up with OB/GYN for further imaging.  Please return to the emergency department for any worsening vaginal bleeding, lightheadedness, passing out, worsening abdominal pain, chest pain or shortness of breath.   ---------------------------------------------------------------------------   Tome 1 tableta de Provera cada 8 horas durante los prximos 7 das. Si su sangrado vaginal se detiene, puede suspender el medicamento. Si su sangrado vaginal contina, deber realizar un seguimiento con OB / GYN para obtener ms imgenes.  Regrese al departamento de emergencias por cualquier empeoramiento del sangrado vaginal, aturdimiento, desmayo, empeoramiento del dolor abdominal, dolor en el pecho o falta de aliento.

## 2018-11-23 NOTE — ED Provider Notes (Addendum)
Assumed care from Benedetto Goad, PA-C at shift change with plan to f/u on pelvic US. Pt will likely need ob-gyn consult.  Per her note,  "Hannah Black is a 47 y.o. Female who is otherwise healthy, does not regularly see a doctor, who presents to the emergency department for evaluation of vaginal bleeding and abdominal pain.  Patient reports that she has been passing large amounts of blood and clots consistently for the past 8 days and over the past 2 to 3 days it is gotten worse.  She reports associated intermittent pain across the lower abdomen and vaginal discomfort.  Denies vaginal discharge.  No fevers or chills, she has had some nausea but no vomiting.  No urinary symptoms.  She reports feeling weak and tired, denies chest pain or shortness of breath.  No palpitations.  She is felt lightheaded but not had any syncopal episodes.  Patient denies history of issues with dysfunctional uterine bleeding in the past.  Patient is Spanish-speaking and her daughter is at the bedside and assists with history.  Daughter also reports that she noticed her mom was warm to the touch today, but she was bundled up in multiple jackets, has not had any fevers throughout the week at home.  She reports that is been 3 days since she saw her mom last and her complexion looks more pale."  Results for orders placed or performed during the hospital encounter of 11/23/18  Wet prep, genital  Result Value Ref Range   Yeast Wet Prep HPF POC NONE SEEN NONE SEEN   Trich, Wet Prep NONE SEEN NONE SEEN   Clue Cells Wet Prep HPF POC NONE SEEN NONE SEEN   WBC, Wet Prep HPF POC FEW (A) NONE SEEN   Sperm NONE SEEN   Lipase, blood  Result Value Ref Range   Lipase 30 11 - 51 U/L  Comprehensive metabolic panel  Result Value Ref Range   Sodium 136 135 - 145 mmol/L   Potassium 3.8 3.5 - 5.1 mmol/L   Chloride 105 98 - 111 mmol/L   CO2 24 22 - 32 mmol/L   Glucose, Bld 93 70 - 99 mg/dL   BUN 8 6 - 20 mg/dL   Creatinine, Ser 0.51 0.44  - 1.00 mg/dL   Calcium 8.9 8.9 - 10.3 mg/dL   Total Protein 7.2 6.5 - 8.1 g/dL   Albumin 3.8 3.5 - 5.0 g/dL   AST 38 15 - 41 U/L   ALT 39 0 - 44 U/L   Alkaline Phosphatase 61 38 - 126 U/L   Total Bilirubin 0.6 0.3 - 1.2 mg/dL   GFR calc non Af Amer >60 >60 mL/min   GFR calc Af Amer >60 >60 mL/min   Anion gap 7 5 - 15  CBC  Result Value Ref Range   WBC 5.9 4.0 - 10.5 K/uL   RBC 3.95 3.87 - 5.11 MIL/uL   Hemoglobin 12.2 12.0 - 15.0 g/dL   HCT 38.3 36.0 - 46.0 %   MCV 97.0 80.0 - 100.0 fL   MCH 30.9 26.0 - 34.0 pg   MCHC 31.9 30.0 - 36.0 g/dL   RDW 12.1 11.5 - 15.5 %   Platelets 365 150 - 400 K/uL   nRBC 0.0 0.0 - 0.2 %  Urinalysis, Routine w reflex microscopic  Result Value Ref Range   Color, Urine RED (A) YELLOW   APPearance TURBID (A) CLEAR   Specific Gravity, Urine NOT DONE 1.005 - 1.030   pH NOT  DONE 5.0 - 8.0   Glucose, UA (A) NEGATIVE mg/dL    TEST NOT REPORTED DUE TO COLOR INTERFERENCE OF URINE PIGMENT   Hgb urine dipstick (A) NEGATIVE    TEST NOT REPORTED DUE TO COLOR INTERFERENCE OF URINE PIGMENT   Bilirubin Urine (A) NEGATIVE    TEST NOT REPORTED DUE TO COLOR INTERFERENCE OF URINE PIGMENT   Ketones, ur (A) NEGATIVE mg/dL    TEST NOT REPORTED DUE TO COLOR INTERFERENCE OF URINE PIGMENT   Protein, ur (A) NEGATIVE mg/dL    TEST NOT REPORTED DUE TO COLOR INTERFERENCE OF URINE PIGMENT   Nitrite (A) NEGATIVE    TEST NOT REPORTED DUE TO COLOR INTERFERENCE OF URINE PIGMENT   Leukocytes, UA (A) NEGATIVE    TEST NOT REPORTED DUE TO COLOR INTERFERENCE OF URINE PIGMENT  Urinalysis, Microscopic (reflex)  Result Value Ref Range   RBC / HPF >50 0 - 5 RBC/hpf   WBC, UA 0-5 0 - 5 WBC/hpf   Bacteria, UA RARE (A) NONE SEEN   Squamous Epithelial / LPF 0-5 0 - 5  I-Stat beta hCG blood, ED  Result Value Ref Range   I-stat hCG, quantitative <5.0 <5 mIU/mL   Comment 3          Type and screen [place]  Result Value Ref Range   ABO/RH(D) O POS    Antibody  Screen PENDING    Sample Expiration      11/26/2018 Performed at [facility], [address] [place], [place] [address]   ABO/Rh  Result Value Ref Range   ABO/RH(D) O POS    No rh immune globuloin      NOT A RH IMMUNE GLOBULIN CANDIDATE, PT RH POSITIVE Performed at [facility] [address] [place], [place] [address]    US Transvaginal Non-ob  Result Date: 11/23/2018 CLINICAL DATA:  Lower abdominal pain.  Heavy bleeding. EXAM: TRANSABDOMINAL AND TRANSVAGINAL ULTRASOUND OF PELVIS DOPPLER ULTRASOUND OF OVARIES TECHNIQUE: Both transabdominal and transvaginal ultrasound examinations of the pelvis were performed. Transabdominal technique was performed for global imaging of the pelvis including uterus, ovaries, adnexal regions, and pelvic cul-de-sac. It was necessary to proceed with endovaginal exam following the transabdominal exam to visualize the endometrium and ovaries. Color and duplex Doppler ultrasound was utilized to evaluate blood flow to the ovaries. COMPARISON:  None. FINDINGS: Uterus Measurements: 9.8 x 5.5 x 6.7 cm = volume: 187.8 mL. There is a 2.2 cm fibroid in the fundus of the uterus. Multiple nabothian cysts are seen in the cervix. Endometrium Thickness: 9.7 mm.  No focal abnormality visualized. Right ovary Measurements: 2.8 x 1.8 x 1.9 cm = volume: 4.9 mL. Evaluation the right ovary is difficult. Doppler imaging in particular was difficult and blood flow could not be confirmed firmed in the normal size right ovary. Left ovary Measurements: 2.9 x 2.6 x 2.9 cm = volume: 11.1 mL. 2.9 cm dominant follicle/cyst. Pulsed Doppler evaluation of the left ovary demonstrate normal low-resistance arterial and venous waveforms. Other findings No abnormal free fluid. IMPRESSION: 1. The right ovary was difficult to image but appears to be normal in size. Blood flow could not be documented in the right ovary but this is likely technical in nature as the right ovary is otherwise grossly  unremarkable. 2. The endometrium measures 9.7 mm which is within normal limits in a woman prior to menopause. If bleeding remains unresponsive to hormonal or medical therapy, sonohysterogram should be considered for focal  lesion work-up. (Ref: Radiological Reasoning: Algorithmic Workup of Abnormal Vaginal Bleeding with Endovaginal Sonography and Sonohysterography. AJR 2008; 858:I50-27) 3. Fibroid in the uterus. 4. Dominant cyst/follicle in the left ovary, requiring no follow-up. Normal blood flow in the left ovary noted. Electronically Signed   By: Dorise Bullion III M.D   On: 11/23/2018 19:20   US Pelvis Complete  Result Date: 11/23/2018 CLINICAL DATA:  Lower abdominal pain.  Heavy bleeding. EXAM: TRANSABDOMINAL AND TRANSVAGINAL ULTRASOUND OF PELVIS DOPPLER ULTRASOUND OF OVARIES TECHNIQUE: Both transabdominal and transvaginal ultrasound examinations of the pelvis were performed. Transabdominal technique was performed for global imaging of the pelvis including uterus, ovaries, adnexal regions, and pelvic cul-de-sac. It was necessary to proceed with endovaginal exam following the transabdominal exam to visualize the endometrium and ovaries. Color and duplex Doppler ultrasound was utilized to evaluate blood flow to the ovaries. COMPARISON:  None. FINDINGS: Uterus Measurements: 9.8 x 5.5 x 6.7 cm = volume: 187.8 mL. There is a 2.2 cm fibroid in the fundus of the uterus. Multiple nabothian cysts are seen in the cervix. Endometrium Thickness: 9.7 mm.  No focal abnormality visualized. Right ovary Measurements: 2.8 x 1.8 x 1.9 cm = volume: 4.9 mL. Evaluation the right ovary is difficult. Doppler imaging in particular was difficult and blood flow could not be confirmed firmed in the normal size right ovary. Left ovary Measurements: 2.9 x 2.6 x 2.9 cm = volume: 11.1 mL. 2.9 cm dominant follicle/cyst. Pulsed Doppler evaluation of the left ovary demonstrate normal low-resistance arterial and venous waveforms. Other  findings No abnormal free fluid. IMPRESSION: 1. The right ovary was difficult to image but appears to be normal in size. Blood flow could not be documented in the right ovary but this is likely technical in nature as the right ovary is otherwise grossly unremarkable. 2. The endometrium measures 9.7 mm which is within normal limits in a woman prior to menopause. If bleeding remains unresponsive to hormonal or medical therapy, sonohysterogram should be considered for focal lesion work-up. (Ref: Radiological Reasoning: Algorithmic Workup of Abnormal Vaginal Bleeding with Endovaginal Sonography and Sonohysterography. AJR 2008; 741:O87-86) 3. Fibroid in the uterus. 4. Dominant cyst/follicle in the left ovary, requiring no follow-up. Normal blood flow in the left ovary noted. Electronically Signed   By: Dorise Bullion III M.D   On: 11/23/2018 19:20   [place] Art/ven Flow Abd Pelv Doppler  Result Date: 11/23/2018 CLINICAL DATA:  Lower abdominal pain.  Heavy bleeding. EXAM: TRANSABDOMINAL AND TRANSVAGINAL ULTRASOUND OF PELVIS DOPPLER ULTRASOUND OF OVARIES TECHNIQUE: Both transabdominal and transvaginal ultrasound examinations of the pelvis were performed. Transabdominal technique was performed for global imaging of the pelvis including uterus, ovaries, adnexal regions, and pelvic cul-de-sac. It was necessary to proceed with endovaginal exam following the transabdominal exam to visualize the endometrium and ovaries. Color and duplex Doppler ultrasound was utilized to evaluate blood flow to the ovaries. COMPARISON:  None. FINDINGS: Uterus Measurements: 9.8 x 5.5 x 6.7 cm = volume: 187.8 mL. There is a 2.2 cm fibroid in the fundus of the uterus. Multiple nabothian cysts are seen in the cervix. Endometrium Thickness: 9.7 mm.  No focal abnormality visualized. Right ovary Measurements: 2.8 x 1.8 x 1.9 cm = volume: 4.9 mL. Evaluation the right ovary is difficult. Doppler imaging in particular was difficult and blood flow could not  be confirmed firmed in the normal size right ovary. Left ovary Measurements: 2.9 x 2.6 x 2.9 cm = volume: 11.1 mL. 2.9 cm dominant follicle/cyst. Pulsed Doppler evaluation of  the left ovary demonstrate normal low-resistance arterial and venous waveforms. Other findings No abnormal free fluid. IMPRESSION: 1. The right ovary was difficult to image but appears to be normal in size. Blood flow could not be documented in the right ovary but this is likely technical in nature as the right ovary is otherwise grossly unremarkable. 2. The endometrium measures 9.7 mm which is within normal limits in a woman prior to menopause. If bleeding remains unresponsive to hormonal or medical therapy, sonohysterogram should be considered for focal lesion work-up. (Ref: Radiological Reasoning: Algorithmic Workup of Abnormal Vaginal Bleeding with Endovaginal Sonography and Sonohysterography. AJR 2008; 831:D17-61) 3. Fibroid in the uterus. 4. Dominant cyst/follicle in the left ovary, requiring no follow-up. Normal blood flow in the left ovary noted. Electronically Signed   By: Dorise Bullion III M.D   On: 11/23/2018 19:20     Physical Exam  Constitutional: She is well-developed, well-nourished, and in no distress. No distress.  HENT:  Head: Normocephalic and atraumatic.  Eyes: Conjunctivae are normal.  Neck: Neck supple.  Cardiovascular: Normal rate.  Pulmonary/Chest: Effort normal.  Abdominal: Soft.  Musculoskeletal: Normal range of motion.  Neurological: She is alert.  Skin: Skin is warm and dry.  Psychiatric: Affect normal.  Nursing note and vitals reviewed.  47 year old female presenting with vaginal bleeding for 1 week.  Laboratory work is reassuring with normal CBC, CMP, lipase. Negative pregnancy test.  UA unable to be interpreted based on bleeding. Will send urine culture. Wet prep with few WBC. No evidence of BV or trich.  [place] with uterine fibroids but no obvious evidence of ovarian torsion. Also with left  ovarian cyst/follicle.   CONSULT with Dr. Ilda Basset from OB-GYN who recommended 10 mg Provera every 8 hours for 1 week. If bleeding stops before 1 week she can discontinue medication early. Can f/u with OB-GYN or the health department as an outpatient.   Evaluated pt and discussed the findings with patient and her daughter at bedside who assist with translation.  Have advised them to follow-up with OB/GYN if bleeding continues or worsens.  Return precautions discussed.  All questions answered and patient stable for discharge.   After discharging the patient, patient's daughter states that the patient is also having some vaginal itching and irritation.  There is no evidence of yeast on her wet prep however will treat empirically for suspected yeast infection with fluconazole.    Rodney Booze, PA-C 11/23/18 2022    Deno Etienne, DO 11/23/18 2031

## 2018-11-24 LAB — GC/CHLAMYDIA PROBE AMP (~~LOC~~) NOT AT ARMC
CHLAMYDIA, DNA PROBE: NEGATIVE
NEISSERIA GONORRHEA: NEGATIVE

## 2018-11-24 LAB — SYPHILIS: RPR W/REFLEX TO RPR TITER AND TREPONEMAL ANTIBODIES, TRADITIONAL SCREENING AND DIAGNOSIS ALGORITHM: RPR Ser Ql: NONREACTIVE

## 2018-11-24 LAB — HIV ANTIBODY (ROUTINE TESTING W REFLEX): HIV Screen 4th Generation wRfx: NONREACTIVE

## 2018-11-25 LAB — URINE CULTURE: Culture: <10000 — AB

## 2019-12-09 IMAGING — US US ART/VEN ABD/PELV/SCROTUM DOPPLER LTD
1 series · 13 of 25 positions shown · non-contrast
Comparison: None.

CLINICAL DATA: Lower abdominal pain.  Heavy bleeding.

EXAM:
TRANSABDOMINAL AND TRANSVAGINAL ULTRASOUND OF PELVIS
DOPPLER ULTRASOUND OF OVARIES
TECHNIQUE: Both transabdominal and transvaginal ultrasound examinations of the
pelvis were performed. Transabdominal technique was performed for
global imaging of the pelvis including uterus, ovaries, adnexal
regions, and pelvic cul-de-sac.
It was necessary to proceed with endovaginal exam following the
transabdominal exam to visualize the endometrium and ovaries. Color
and duplex Doppler ultrasound was utilized to evaluate blood flow to
the ovaries.

[Series 1: us art/ven abd/pelv/scrotum doppler ltd · 0.24mm/px · 13 of 117 slices shown]
[im 1/117]
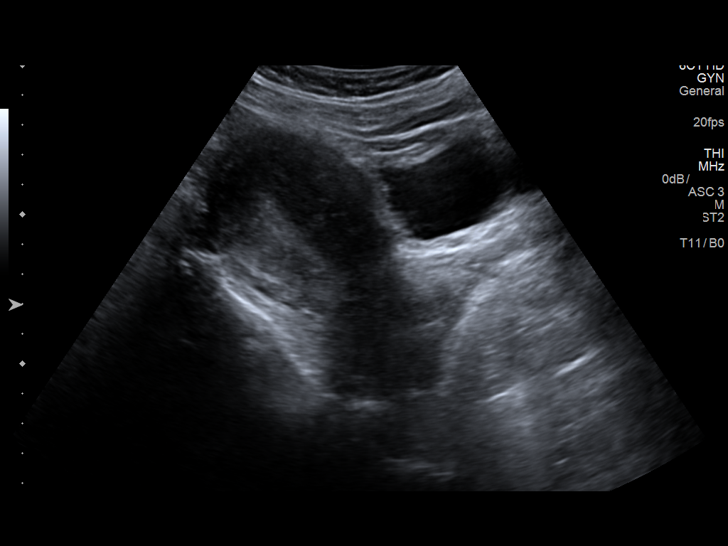
[im 10/117]
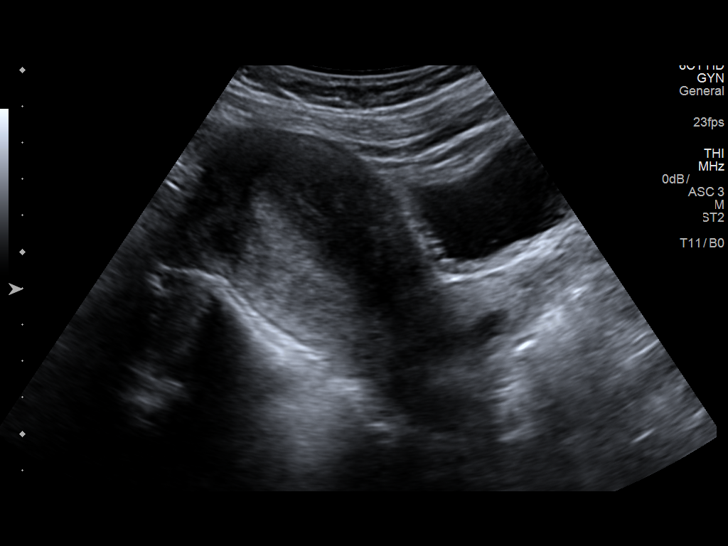
[im 20/117]
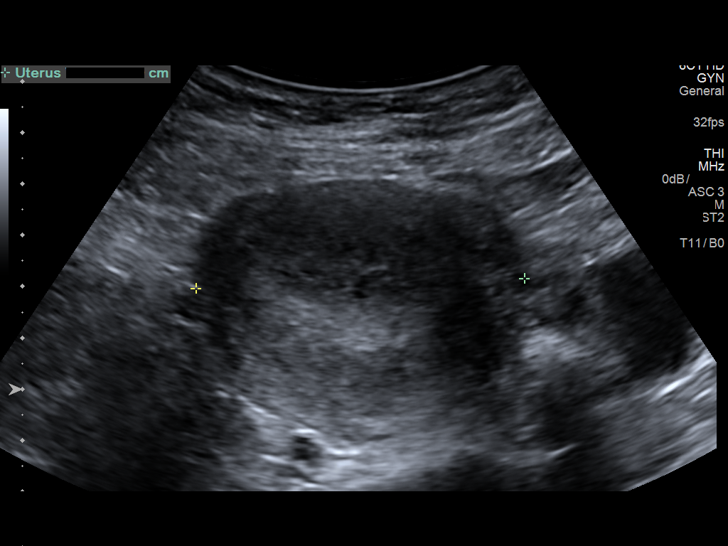
[im 30/117]
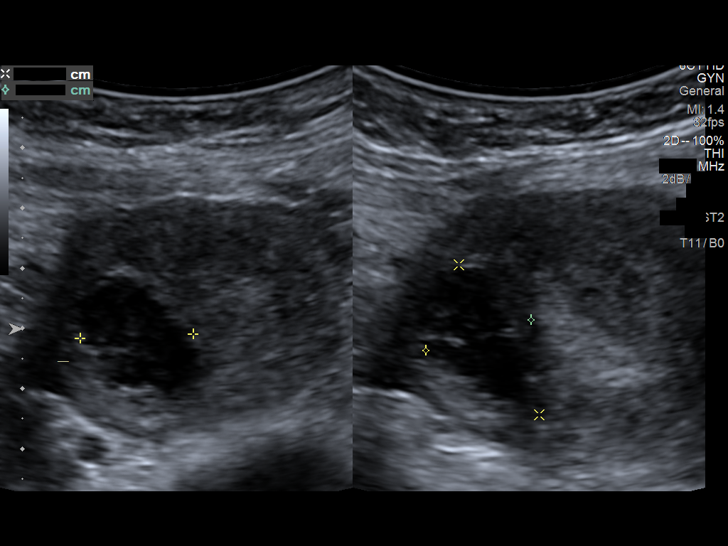
[im 39/117]
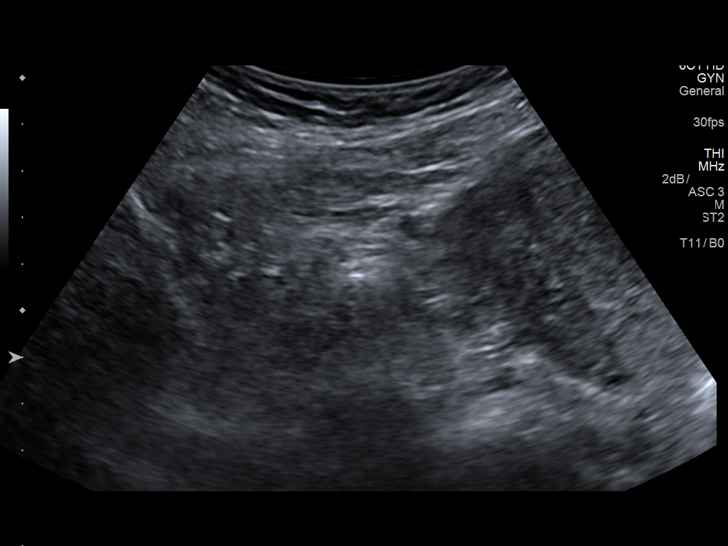
[im 49/117]
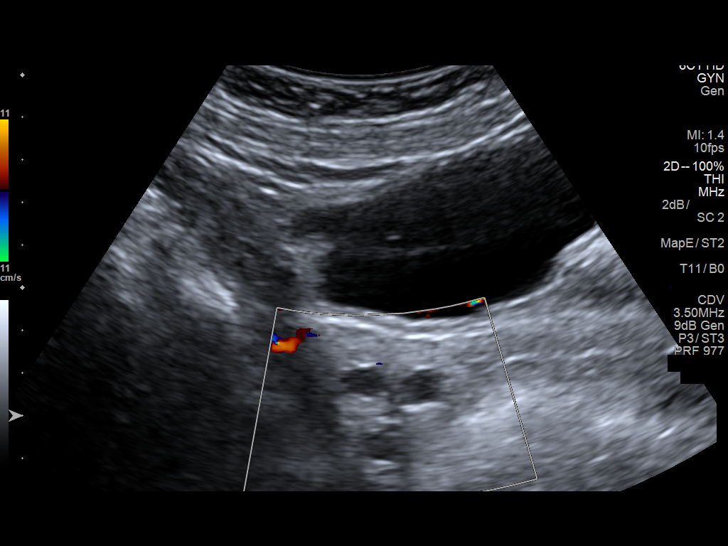
[im 59/117]
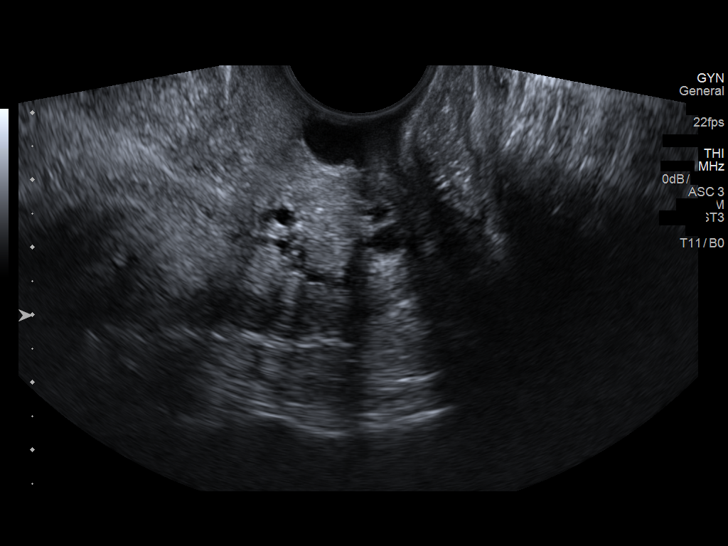
[im 68/117]
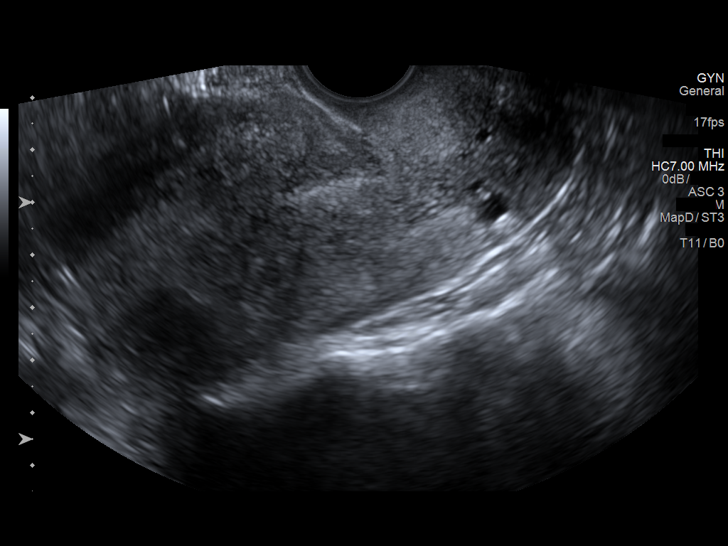
[im 78/117]
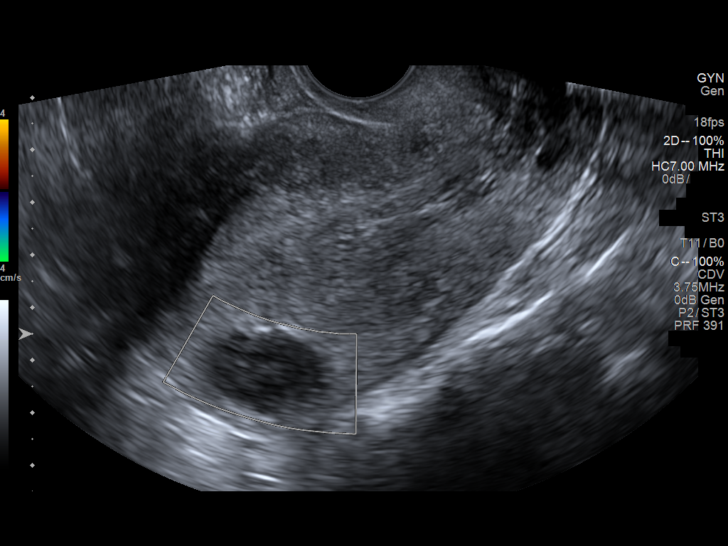
[im 88/117]
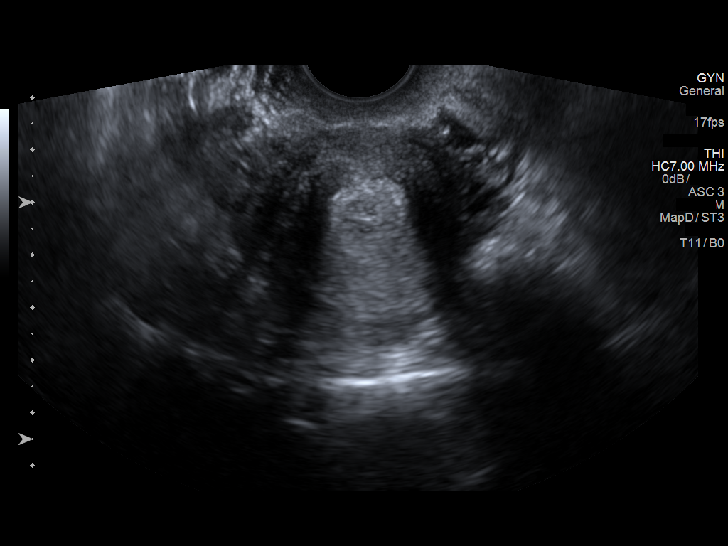
[im 97/117]
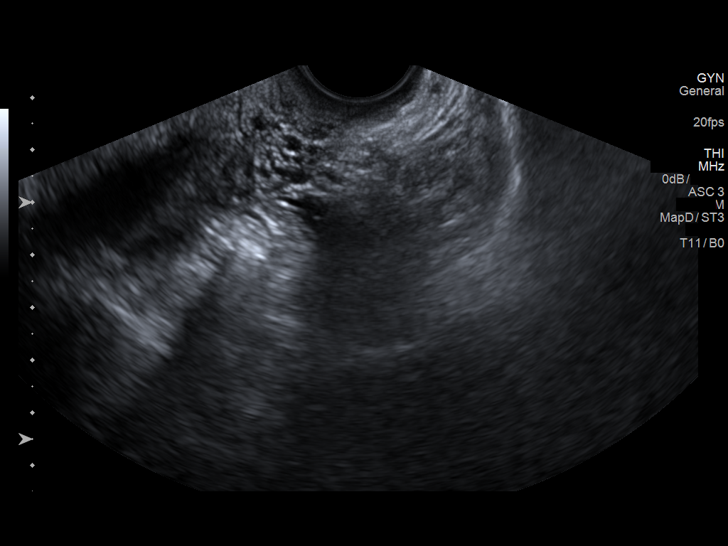
[im 107/117]
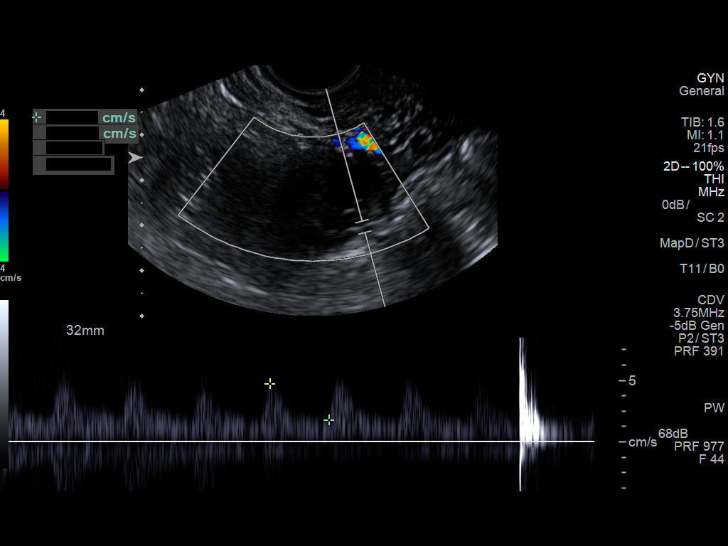
[im 117/117]
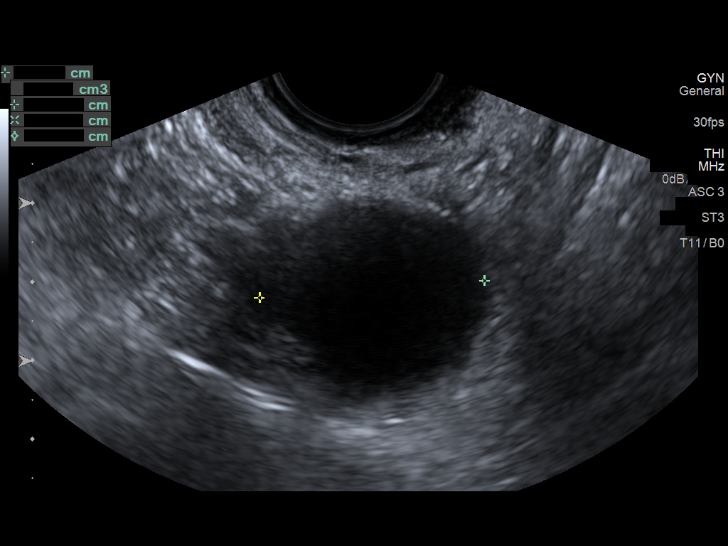

[13 of 25 positions shown; findings below may reference images not displayed]

FINDINGS: Uterus

Measurements: 9.8 x 5.5 x 6.7 cm = volume: 187.8 mL. There is a
cm fibroid in the fundus of the uterus. Multiple nabothian cysts are
seen in the cervix.

Endometrium

Thickness: 9.7 mm.  No focal abnormality visualized.

Right ovary

Measurements: 2.8 x 1.8 x 1.9 cm = volume: 4.9 mL. Evaluation the
right ovary is difficult. Doppler imaging in particular was
difficult and blood flow could not be confirmed firmed in the normal
size right ovary.

Left ovary

Measurements: 2.9 x 2.6 x 2.9 cm = volume: 11.1 mL. 2.9 cm dominant
follicle/cyst.

Pulsed Doppler evaluation of the left ovary demonstrate normal
low-resistance arterial and venous waveforms.

Other findings

No abnormal free fluid.
IMPRESSION: 1. The right ovary was difficult to image but appears to be normal
in size. Blood flow could not be documented in the right ovary but
this is likely technical in nature as the right ovary is otherwise
grossly unremarkable.
2. The endometrium measures 9.7 mm which is within normal limits in
a woman prior to menopause. If bleeding remains unresponsive to
hormonal or medical therapy, sonohysterogram should be considered
for focal lesion work-up. (Ref: Radiological Reasoning: Algorithmic
Workup of Abnormal Vaginal Bleeding with Endovaginal Sonography and
Sonohysterography. AJR 9778; 191:S68-73)
3. Fibroid in the uterus.
4. Dominant cyst/follicle in the left ovary, requiring no follow-up.
Normal blood flow in the left ovary noted.
